# Patient Record
Sex: Male | Born: 1975 | Race: Black or African American | Hispanic: No | Marital: Married | Smoking: Never smoker
Health system: Southern US, Community
[De-identification: ages and names within clinical notes are randomized; demographics above are authoritative.]

---

## 2013-08-27 ENCOUNTER — Emergency Department (HOSPITAL_COMMUNITY): Payer: Self-pay

## 2013-08-27 ENCOUNTER — Emergency Department (HOSPITAL_COMMUNITY)
Admission: EM | Admit: 2013-08-27 | Discharge: 2013-08-27 | Disposition: A | Discharge status: Home / Self Care | Payer: Self-pay | Attending: Emergency Medicine | Admitting: Emergency Medicine

## 2013-08-27 ENCOUNTER — Encounter (HOSPITAL_COMMUNITY): Payer: Self-pay | Admitting: Nurse Practitioner

## 2013-08-27 DIAGNOSIS — R630 Anorexia: Secondary | ICD-10-CM | POA: Insufficient documentation

## 2013-08-27 DIAGNOSIS — R509 Fever, unspecified: Secondary | ICD-10-CM | POA: Insufficient documentation

## 2013-08-27 DIAGNOSIS — R0989 Other specified symptoms and signs involving the circulatory and respiratory systems: Secondary | ICD-10-CM | POA: Insufficient documentation

## 2013-08-27 DIAGNOSIS — R062 Wheezing: Secondary | ICD-10-CM | POA: Insufficient documentation

## 2013-08-27 DIAGNOSIS — R51 Headache: Secondary | ICD-10-CM | POA: Insufficient documentation

## 2013-08-27 DIAGNOSIS — J189 Pneumonia, unspecified organism: Secondary | ICD-10-CM

## 2013-08-27 MED ORDER — AZITHROMYCIN 250 MG PO TABS: 250.0000 mg | ORAL_TABLET | Freq: Every day | ORAL | Status: DC

## 2013-08-27 MED ORDER — ACETAMINOPHEN 325 MG PO TABS
650.0000 mg | ORAL_TABLET | Freq: Four times a day (QID) | ORAL | Status: DC | PRN
  Administered 2013-08-27: 650 mg via ORAL
  Filled 2013-08-27: qty 2

## 2013-08-27 MED ORDER — ALBUTEROL SULFATE HFA 108 (90 BASE) MCG/ACT IN AERS
2.0000 | INHALATION_SPRAY | RESPIRATORY_TRACT | Status: DC | PRN
  Administered 2013-08-27: 2 via RESPIRATORY_TRACT
  Filled 2013-08-27: qty 6.7

## 2013-08-27 NOTE — ED Provider Notes (Signed)
CSN: [id number]     Arrival date & time 08/27/13  1729 History  This chart was scribed for non-physician practitioner working with Junius Argyle, MD by Leone Payor, ED Scribe. This patient was seen in room TR05C/TR05C and the patient's care was started at 1729.    Chief Complaint  Patient presents with  . URI    The history is provided by the patient. No language interpreter was used.    HPI Comments: Jeremy Graham is a 37 y.o. male who presents to the Emergency Department complaining of 3 days of gradual onset, gradually worsening, constant cough. He has associated fever (102.3 in the ED), chills, HA, decreased appetite. He states the cough is productive of clear mucous. He has taken tylenol, robitussin, cough drops without relief. He denies abdominal pain, nausea, emesis, diarrhea, sore throat. Does report being here Saturday, 9/27 with girlfriend for similar symptoms and believes he caught this from her.  Denies recent travel. Denies hx of asthma or COPD.  Pt is an occasional alcohol user but denies smoking.   History reviewed. No pertinent past medical history. History reviewed. No pertinent past surgical history. History reviewed. No pertinent family history. History  Substance Use Topics  . Smoking status: Never Smoker   . Smokeless tobacco: Not on file  . Alcohol Use: Yes    Review of Systems  Constitutional: Positive for fever and chills.  HENT: Negative for sore throat.   Respiratory: Positive for cough. Negative for shortness of breath.   Gastrointestinal: Negative for nausea, vomiting, abdominal pain and diarrhea.  All other systems reviewed and are negative.    Allergies  Shellfish allergy  Home Medications   Current Outpatient Rx  Name  Route  Sig  Dispense  Refill  . azithromycin (ZITHROMAX) 250 MG tablet   Oral   Take 1 tablet (250 mg total) by mouth daily. Take first 2 tablets together, then 1 every day until finished.   6 tablet   0    BP 130/75   Pulse 84  Temp(Src) 101.3 F (38.5 C) (Oral)  Resp 18  SpO2 100% Physical Exam  Nursing note and vitals reviewed. Constitutional: He is oriented to person, place, and time. He appears well-developed and well-nourished.  HENT:  Head: Normocephalic and atraumatic.  Eyes: EOM are normal.  Neck: Normal range of motion. Neck supple.  Cardiovascular: Normal rate, regular rhythm and normal heart sounds.   Pulmonary/Chest: Effort normal. No respiratory distress. He has wheezes. He has rales. He exhibits no tenderness.  Coarse breath sounds throughout all lung fields. No respiratory distress, able to speak in full sentences w/o difficulty.  Abdominal: Soft. Bowel sounds are normal. He exhibits no distension. There is no tenderness.  Musculoskeletal: Normal range of motion.  Lymphadenopathy:    He has no cervical adenopathy.  Neurological: He is alert and oriented to person, place, and time.  Skin: Skin is warm and dry.  Psychiatric: He has a normal mood and affect. His behavior is normal.    ED Course  Procedures   DIAGNOSTIC STUDIES: Oxygen Saturation is 98% on RA, normal by my interpretation.    COORDINATION OF CARE: 7:01 PM Will prescribe antibiotics and an inhaler. Discussed treatment plan with pt at bedside and pt agreed to plan.   Labs Review Labs Reviewed - No data to display Imaging Review Dg Chest 2 View (if Patient Has Fever And/or Copd)  08/27/2013   CLINICAL DATA:  Upper respiratory infection with cough and fever.  EXAM: CHEST  2 VIEW  COMPARISON:  None.  FINDINGS: Two views of the chest demonstrate streaky densities in the left upper lobe and left hilar region. No evidence for pleural effusions. The right lung is clear. Heart and mediastinum are within normal limits. Bony thorax is intact.  IMPRESSION: Streaky densities in the left hilar and left upper lung region. Findings are concerning for pneumonia.   Electronically Signed   By: Richarda Overlie M.D.   On: 08/27/2013 18:41     MDM   1. Left upper lobe pneumonia    Pt c/u URI symptoms, found to have left upper lobe pneumonia on CXR.  Pt is otherwise healthy.  Discussed use of albuterol inhaler to aid with wheezing. Rx: azithromycin.  May use OTC tylenol and/or motrin as needed for fever and pain.  Return precautions given. Pt verbalized understanding and agreement with tx plan.  I personally performed the services described in this documentation, which was scribed in my presence. The recorded information has been reviewed and is accurate.    Junius Finner, PA-C 08/28/13 [phone]

## 2013-08-27 NOTE — ED Notes (Addendum)
C/o cough with clear mucous, fevers, headaches since Friday. No sob, speaking in full unlabored sentences now. Last tylenol at 0830 this am

## 2013-08-28 NOTE — ED Provider Notes (Signed)
Medical screening examination/treatment/procedure(s) were performed by non-physician practitioner and as supervising physician I was immediately available for consultation/collaboration.   Junius Argyle, MD 08/28/13 [phone]

## 2015-01-06 IMAGING — CR DG CHEST 2V
2 series · 2 of 2 positions shown · non-contrast
Comparison: None.

CLINICAL DATA: Upper respiratory infection with cough and fever.

EXAM:
CHEST  2 VIEW

[w chest pa]
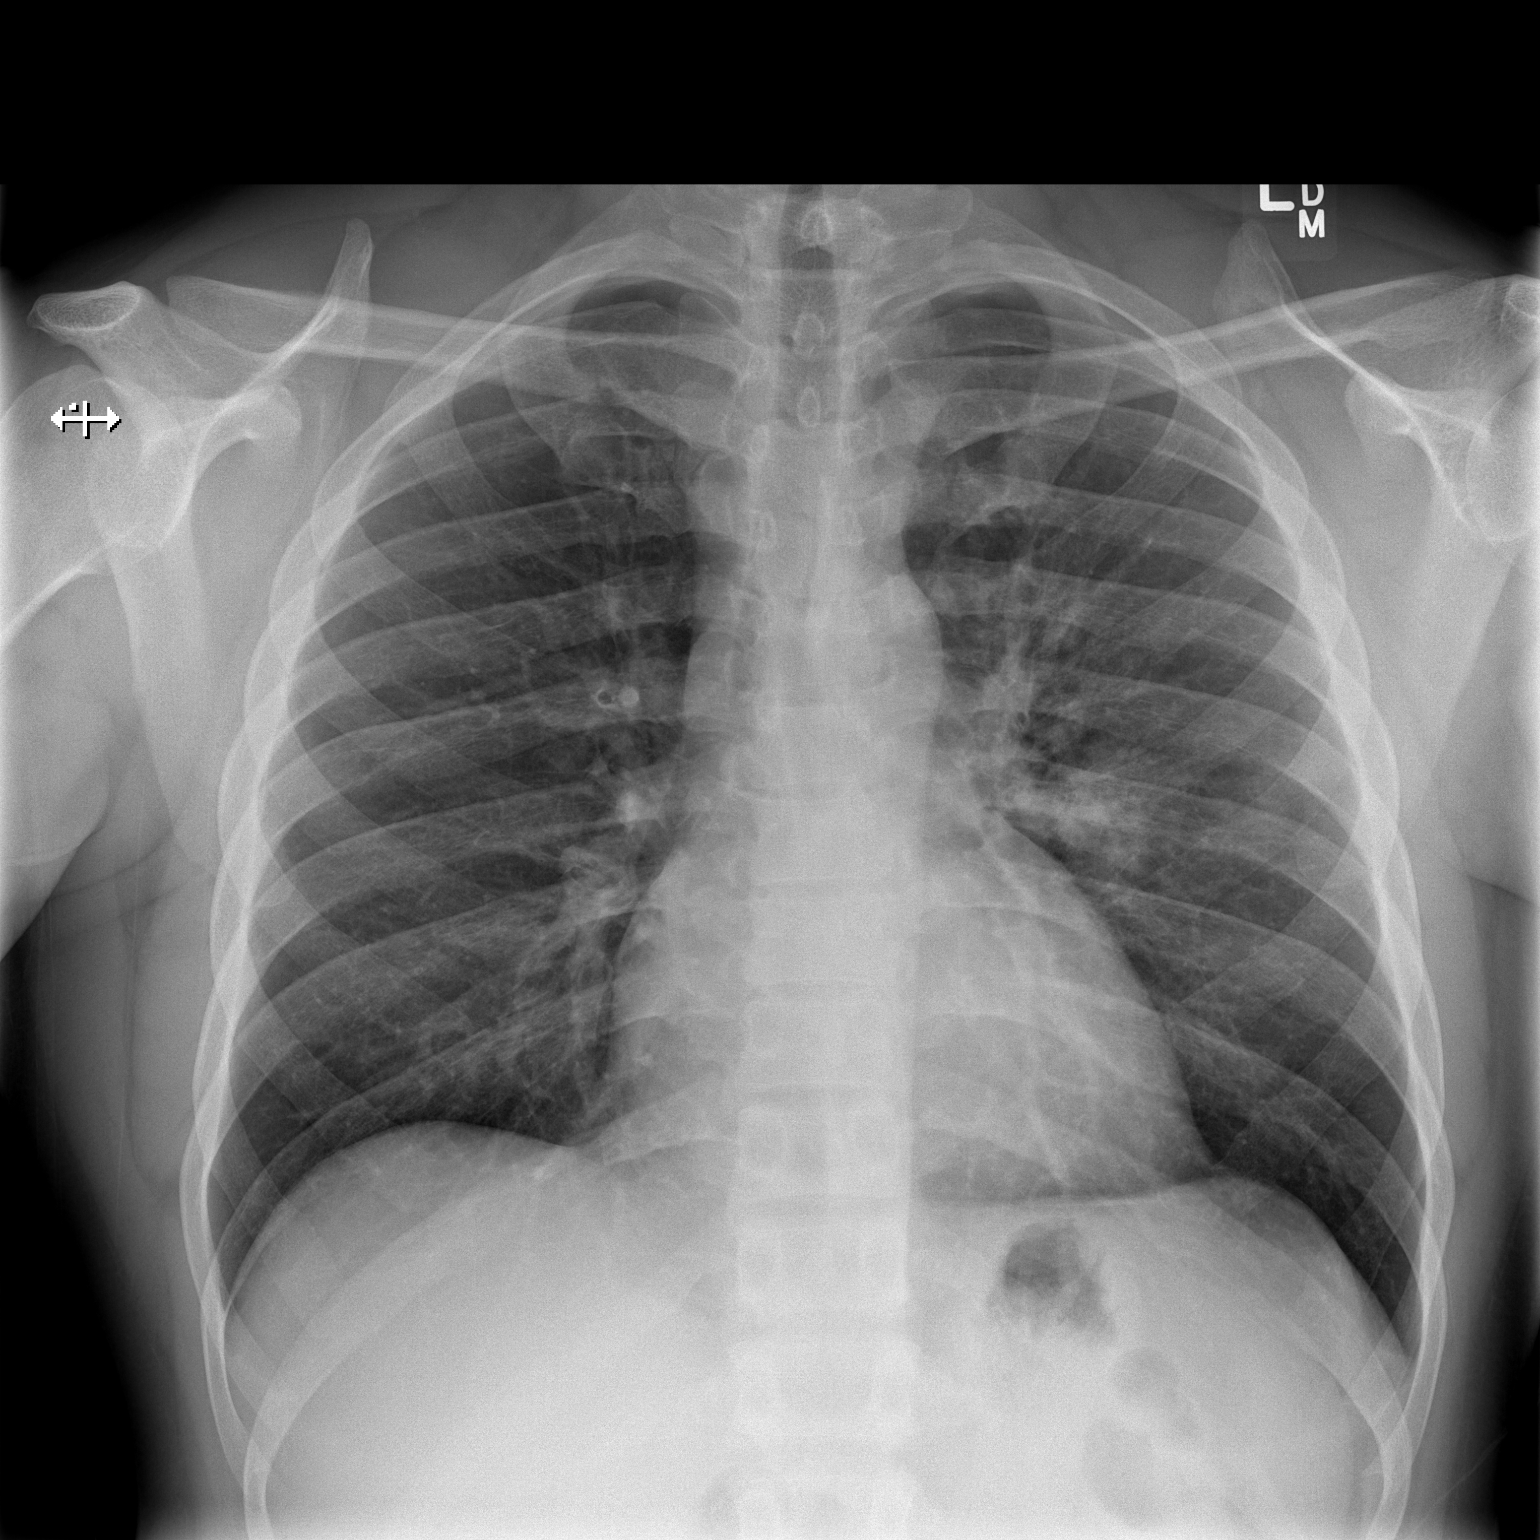

[w chest lat]
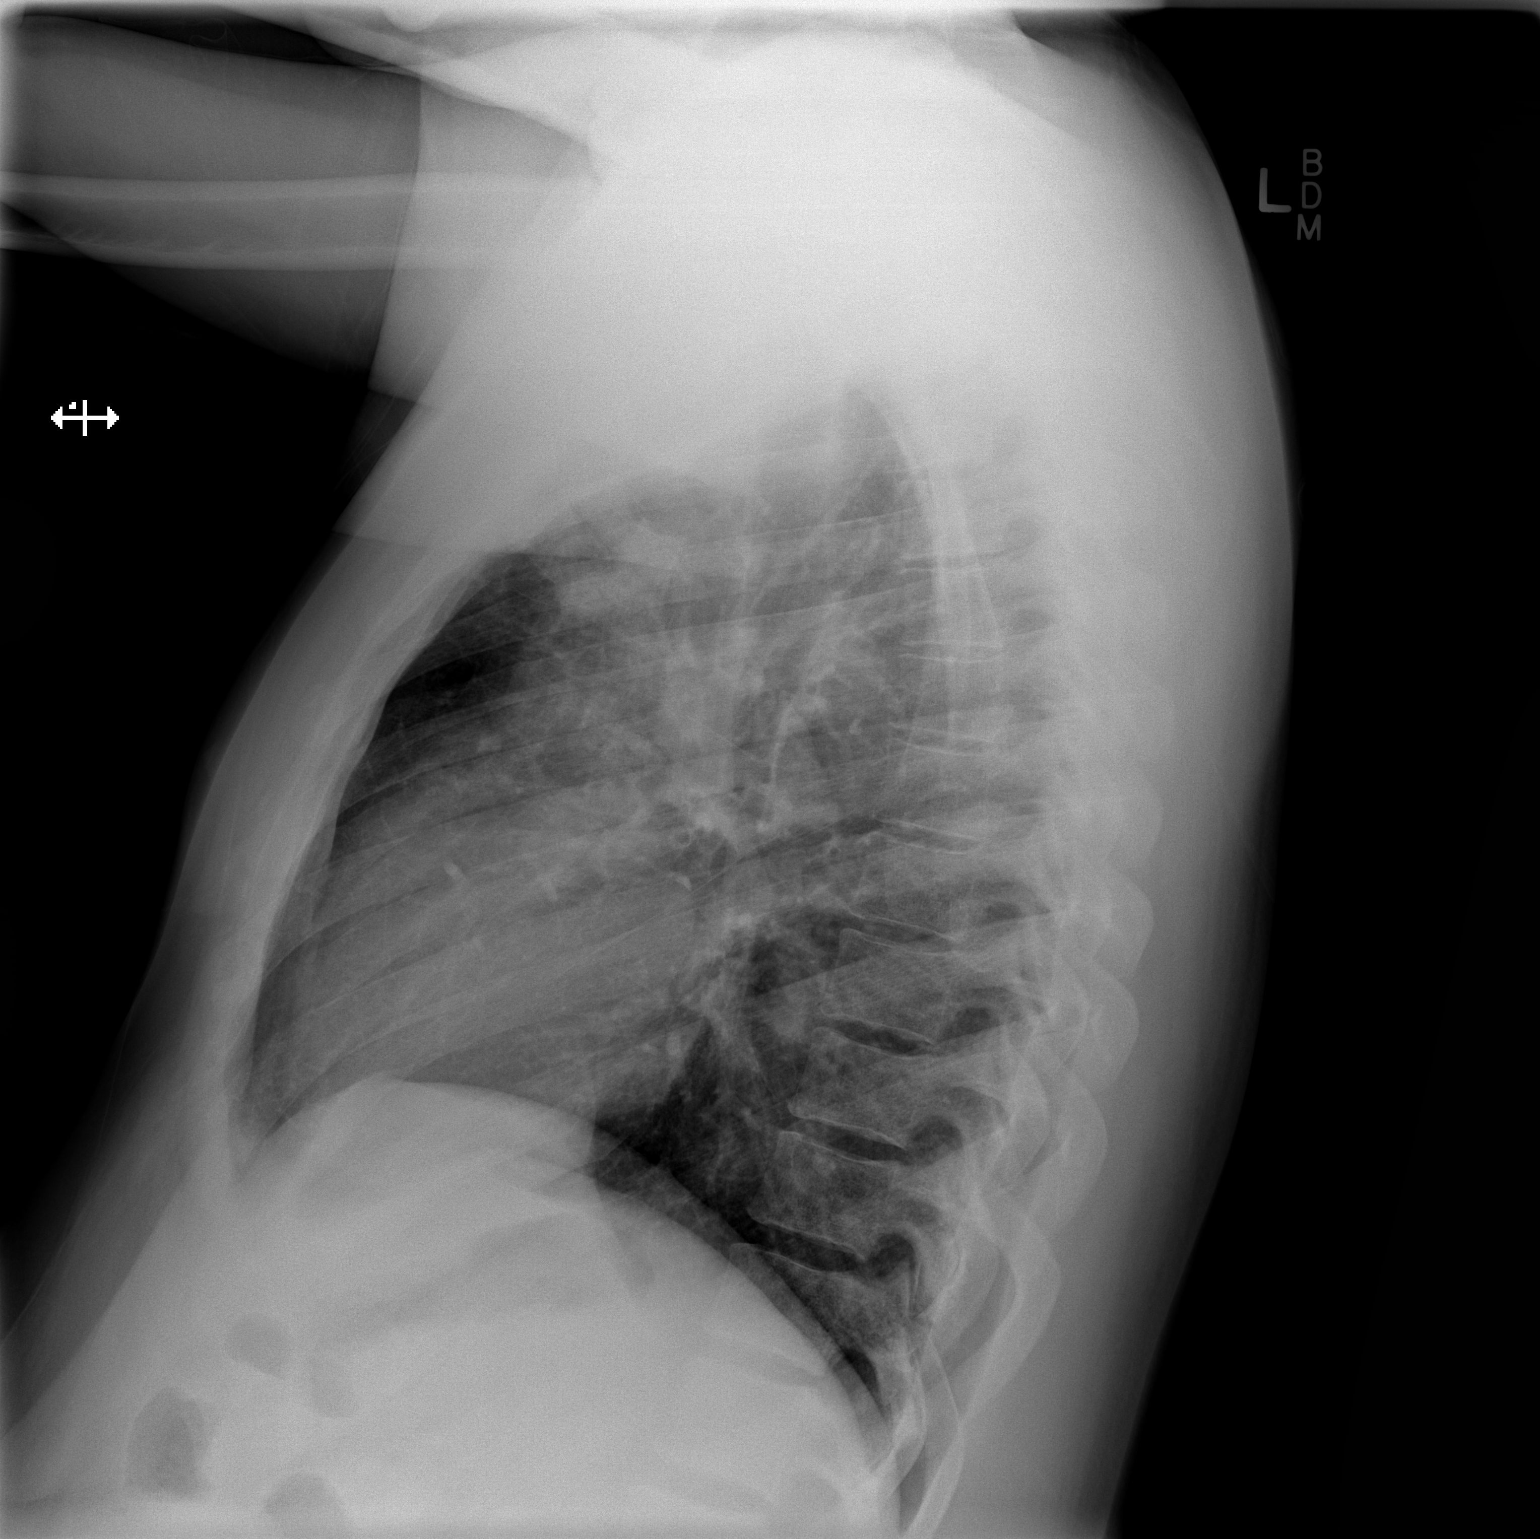

[2 of 2 positions shown; findings below may reference images not displayed]

FINDINGS: Two views of the chest demonstrate streaky densities in the left
upper lobe and left hilar region. No evidence for pleural effusions.
The right lung is clear. Heart and mediastinum are within normal
limits. Bony thorax is intact.
IMPRESSION: Streaky densities in the left hilar and left upper lung region.
Findings are concerning for pneumonia.

## 2020-06-12 ENCOUNTER — Telehealth: Payer: Self-pay | Admitting: Internal Medicine

## 2020-06-16 ENCOUNTER — Telehealth (INDEPENDENT_AMBULATORY_CARE_PROVIDER_SITE_OTHER): Payer: Medicaid Other | Admitting: Physician Assistant

## 2020-06-16 ENCOUNTER — Other Ambulatory Visit: Payer: Self-pay

## 2020-06-16 ENCOUNTER — Telehealth: Payer: Self-pay

## 2020-06-16 VITALS — Ht 69.0 in | Wt 230.0 lb

## 2020-06-16 DIAGNOSIS — Z13220 Encounter for screening for lipoid disorders: Secondary | ICD-10-CM | POA: Diagnosis not present

## 2020-06-16 DIAGNOSIS — L731 Pseudofolliculitis barbae: Secondary | ICD-10-CM

## 2020-06-16 DIAGNOSIS — Z1159 Encounter for screening for other viral diseases: Secondary | ICD-10-CM

## 2020-06-16 DIAGNOSIS — G8929 Other chronic pain: Secondary | ICD-10-CM

## 2020-06-16 DIAGNOSIS — M545 Low back pain: Secondary | ICD-10-CM

## 2020-06-16 DIAGNOSIS — Z0000 Encounter for general adult medical examination without abnormal findings: Secondary | ICD-10-CM

## 2020-06-16 DIAGNOSIS — E6609 Other obesity due to excess calories: Secondary | ICD-10-CM

## 2020-06-16 DIAGNOSIS — Z6833 Body mass index (BMI) 33.0-33.9, adult: Secondary | ICD-10-CM

## 2020-06-16 NOTE — Telephone Encounter (Signed)
Contacted pt. on the location of the mobile unit on 7/20 and times. Pt. confimed the location of the mobile unit and discussed stopping by for a walk in appt.

## 2020-06-16 NOTE — Progress Notes (Signed)
New Patient Office Visit  Subjective:  Patient ID: Jeremy Graham, male    DOB: 1975/12/03  Age: 44 y.o. MRN: [id number]  CC:  Chief Complaint  Patient presents with  . Establish Care   Virtual Visit via Telephone Note  I connected with Jeremy Graham on 06/16/20 at  3:50 PM EDT by telephone and verified that I am speaking with the correct person using two identifiers.  Location: Patient: Home Provider: Primary Care at [facility]   I discussed the limitations, risks, security and privacy concerns of performing an evaluation and management service by telephone and the availability of in person appointments. I also discussed with the patient that there may be a patient responsible charge related to this service. The patient expressed understanding and agreed to proceed.   History of Present Illness:   HPI Jeremy Graham reports that he feels his health is good overall.  Does complain of on and off low  back pain.  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  Reports this has been ongoing for several years.  Reports previous injury or trauma.  Reports that he will rest or use pain relievers over-the-counter with relief.  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   Reports that he has had an ingrown hair on his chin for the past several years, states it is not tender, but is bothersome.  Denies red streaking, erythema, purulence has tried hot compresses without relief.     Observations/Objective:  Patient history reviewed, no physical exam completed History reviewed. No pertinent past medical history.  History reviewed. No pertinent surgical history.  Family History  Problem Relation Age of Onset  . Diabetes Mother     Social History   Socioeconomic History  . Marital status: Married    Spouse name: Not on file  . Number of children: Not on file  . Years of education: Not on file  .  Highest education level: Not on file  Occupational History  . Not on file  Tobacco Use  . Smoking status: Never Smoker  . Smokeless tobacco: Never Used  Substance and Sexual Activity  . Alcohol use: Yes  . Drug use: No  . Sexual activity: Yes  Other Topics Concern  . Not on file  Social History Narrative  . Not on file   Social Determinants of Health   Financial Resource Strain:   . Difficulty of Paying Living Expenses:   Food Insecurity:   . Worried About Programme researcher, broadcasting/film/video in the Last Year:   . Barista in the Last Year:   Transportation Needs:   . Freight forwarder (Medical):   Marland Kitchen Lack of Transportation (Non-Medical):   Physical Activity:   . Days of Exercise per Week:   . Minutes of Exercise per Session:   Stress:   . Feeling of Stress :   Social Connections:   . Frequency of Communication with Friends and Family:   . Frequency of Social Gatherings with Friends and Family:   . Attends Religious Services:   . Active Member of Clubs or Organizations:   . Attends Banker Meetings:   Marland Kitchen Marital Status:   Intimate Partner Violence:   . Fear of Current or Ex-Partner:   . Emotionally Abused:   Marland Kitchen Physically Abused:   . Sexually Abused:     ROS Review of Systems  Constitutional: Negative for chills and fever.  HENT: Negative.   Eyes: Negative.   Respiratory: Negative.   Cardiovascular: Negative.   Gastrointestinal: Negative.   Endocrine: Negative.   Genitourinary: Negative.   Musculoskeletal: Positive for back pain.  Skin: Negative.   Allergic/Immunologic: Negative.   Neurological: Negative.   Hematological: Negative.   Psychiatric/Behavioral: Negative.     Objective:   Today's Vitals: Ht 5\' 9"  (1.753 m)   Wt 230 lb (104.3 kg)   BMI 33.97 kg/m     Assessment & Plan:   Problem List Items Addressed This Visit    None    Visit Diagnoses    Chronic midline low back pain without sciatica    -  Primary   Relevant Medications    aspirin 325 MG EC tablet   Wellness examination       Relevant Orders   CBC with Differential/Platelet   Comp. Metabolic Panel (12)   TSH   Screening, lipid       Relevant Orders   Lipid panel   Class 1 obesity due to excess calories with body mass index (BMI) of 33.0 to 33.9 in adult, unspecified whether serious comorbidity present       Encounter for HCV screening test for low risk patient       Relevant Orders   Hepatitis c antibody (reflex)   Ingrown hair          Outpatient Encounter Medications as of 06/16/2020  Medication Sig  . aspirin 325 MG EC tablet Take 325 mg by mouth daily.   No facility-administered encounter medications on file as of 06/16/2020.  Assessment and Plan: 1. Chronic midline low back pain without sciatica Patient education given on modest weight loss, gentle stretching, Voltaren over-the-counter, strengthening core.  2. Wellness examination Encouraged patient to come to mobile unit or Primary Care at [facility] for fasting labs.  Patient was given an appointment to follow-up with Dr. [facility] - CBC with Differential/Platelet; Future - Comp. Metabolic Panel (12); Future - TSH; Future  3. Screening, lipid  - Lipid panel; Future  4. Class 1 obesity due to excess calories with body mass index (BMI) of 33.0 to 33.9 in adult, unspecified whether serious comorbidity present   5. Encounter for HCV screening test for low risk patient  - Hepatitis c antibody (reflex); Future  6. Ingrown hair Patient encouraged to present to mobile unit for prompt evaluation   Follow Up Instructions:    I discussed the assessment and treatment plan with the patient. The patient was provided an opportunity to ask questions and all were answered. The patient agreed with the plan and demonstrated an understanding of the instructions.   The patient was advised to call back or seek an in-person evaluation if the symptoms worsen or if the condition fails to improve as  anticipated.  I provided 30 minutes of non-face-to-face time during this encounter.   Jeremy Lader S Mayers, PA-C    Follow-up: Return in about 1 day (around 06/17/2020) for Fasting  labs to mobile unit.   06/19/2020 Mayers, PA-C

## 2020-06-18 NOTE — Patient Instructions (Signed)
I encourage you to come to the mobile unit to be seen for the ingrown hair, and to have fasting labs completed.  You can also  call Primary Care at [facility] to make an appointment to have the fasting labs completed.  For your back pain, I encourage you to work on a modest weight loss, work on core strengthening exercises, use Voltaren over-the-counter and do gentle stretching several times a day most importantly before and after work activities.  Please let us know if there is anything else we can do for you  Roney Jaffe, PA-C Physician Assistant [facility] [email or web address]    Chronic Back Pain When back pain lasts longer than 3 months, it is called chronic back pain.The cause of your back pain may not be known. Some common causes include:  Wear and tear (degenerative disease) of the bones, ligaments, or disks in your back.  Inflammation and stiffness in your back (arthritis). People who have chronic back pain often go through certain periods in which the pain is more intense (flare-ups). Many people can learn to manage the pain with home care. Follow these instructions at home: Pay attention to any changes in your symptoms. Take these actions to help with your pain: Activity   Avoid bending and other activities that make the problem worse.  Maintain a proper position when standing or sitting: ? When standing, keep your upper back and neck straight, with your shoulders pulled back. Avoid slouching. ? When sitting, keep your back straight and relax your shoulders. Do not round your shoulders or pull them backward.  Do not sit or stand in one place for long periods of time.  Take brief periods of rest throughout the day. This will reduce your pain. Resting in a lying or standing position is usually better than sitting to rest.  When you are resting for longer periods, mix in some mild activity or stretching between periods  of rest. This will help to prevent stiffness and pain.  Get regular exercise. Ask your health care provider what activities are safe for you.  Do not lift anything that is heavier than 10 lb (4.5 kg). Always use proper lifting technique, which includes: ? Bending your knees. ? Keeping the load close to your body. ? Avoiding twisting.  Sleep on a firm mattress in a comfortable position. Try lying on your side with your knees slightly bent. If you lie on your back, put a pillow under your knees. Managing pain  If directed, apply ice to the painful area. Your health care provider may recommend applying ice during the first 24-48 hours after a flare-up begins. ? Put ice in a plastic bag. ? Place a towel between your skin and the bag. ? Leave the ice on for 20 minutes, 2-3 times per day.  If directed, apply heat to the affected area as often as told by your health care provider. Use the heat source that your health care provider recommends, such as a moist heat pack or a heating pad. ? Place a towel between your skin and the heat source. ? Leave the heat on for 20-30 minutes. ? Remove the heat if your skin turns bright red. This is especially important if you are unable to feel pain, heat, or cold. You may have a greater risk of getting burned.  Try soaking in a warm tub.  Take over-the-counter and prescription medicines only as told by your health care provider.  Keep all follow-up visits as told  by your health care provider. This is important. Contact a health care provider if:  You have pain that is not relieved with rest or medicine. Get help right away if:  You have weakness or numbness in one or both of your legs or feet.  You have trouble controlling your bladder or your bowels.  You have nausea or vomiting.  You have pain in your abdomen.  You have shortness of breath or you faint. This information is not intended to replace advice given to you by your health care  provider. Make sure you discuss any questions you have with your health care provider. Document Revised: 03/08/2019 Document Reviewed: 05/25/2017 Elsevier Patient Education  2020 [employer].

## 2020-06-19 ENCOUNTER — Ambulatory Visit: Payer: Medicaid Other | Admitting: Physician Assistant

## 2020-06-19 ENCOUNTER — Other Ambulatory Visit: Payer: Self-pay

## 2020-06-19 VITALS — BP 133/89 | HR 75 | Temp 98.8°F | Ht 71.0 in | Wt 248.0 lb

## 2020-06-19 DIAGNOSIS — Z0000 Encounter for general adult medical examination without abnormal findings: Secondary | ICD-10-CM

## 2020-06-19 DIAGNOSIS — Z1159 Encounter for screening for other viral diseases: Secondary | ICD-10-CM | POA: Diagnosis not present

## 2020-06-19 DIAGNOSIS — R030 Elevated blood-pressure reading, without diagnosis of hypertension: Secondary | ICD-10-CM

## 2020-06-19 DIAGNOSIS — M2740 Unspecified cyst of jaw: Secondary | ICD-10-CM

## 2020-06-19 DIAGNOSIS — Z13220 Encounter for screening for lipoid disorders: Secondary | ICD-10-CM | POA: Diagnosis not present

## 2020-06-19 NOTE — Patient Instructions (Signed)
I encourage you to return to the mobile unit or call Primary Care at [facility] to make an appointment for fasting labs.  I am starting a referral for you to be seen by general surgery to further evaluate the cyst on your jawline.  I encourage you to monitor your blood pressure, keep written log, bring readings to your next office visit.  Please let us know if there is anything else we can do for you  Roney Jaffe, PA-C Physician Assistant [facility] [email or web address]    Preventing Hypertension Hypertension, commonly called high blood pressure, is when the force of blood pumping through the arteries is too strong. Arteries are blood vessels that carry blood from the heart throughout the body. Over time, hypertension can damage the arteries and decrease blood flow to important parts of the body, including the brain, heart, and kidneys. Often, hypertension does not cause symptoms until blood pressure is very high. For this reason, it is important to have your blood pressure checked on a regular basis. Hypertension can often be prevented with diet and lifestyle changes. If you already have hypertension, you can control it with diet and lifestyle changes, as well as medicine. What nutrition changes can be made? Maintain a healthy diet. This includes:  Eating less salt (sodium). Ask your health care provider how much sodium is safe for you to have. The general recommendation is to consume less than 1 tsp (2,300 mg) of sodium a day. ? Do not add salt to your food. ? Choose low-sodium options when grocery shopping and eating out.  Limiting fats in your diet. You can do this by eating low-fat or fat-free dairy products and by eating less red meat.  Eating more fruits, vegetables, and whole grains. Make a goal to eat: ? 1-2 cups of fresh fruits and vegetables each day. ? 3-4 servings of whole grains each day.  Avoiding foods and  beverages that have added sugars.  Eating fish that contain healthy fats (omega-3 fatty acids), such as mackerel or salmon. If you need help putting together a healthy eating plan, try the DASH diet. This diet is high in fruits, vegetables, and whole grains. It is low in sodium, red meat, and added sugars. DASH stands for Dietary Approaches to Stop Hypertension. What lifestyle changes can be made?   Lose weight if you are overweight. Losing just 3?5% of your body weight can help prevent or control hypertension. ? For example, if your present weight is 200 lb (91 kg), a loss of 3-5% of your weight means losing 6-10 lb (2.7-4.5 kg). ? Ask your health care provider to help you with a diet and exercise plan to safely lose weight.  Get enough exercise. Do at least 150 minutes of moderate-intensity exercise each week. ? You could do this in short exercise sessions several times a day, or you could do longer exercise sessions a few times a week. For example, you could take a brisk 10-minute walk or bike ride, 3 times a day, for 5 days a week.  Find ways to reduce stress, such as exercising, meditating, listening to music, or taking a yoga class. If you need help reducing stress, ask your health care provider.  Do not smoke. This includes e-cigarettes. Chemicals in tobacco and nicotine products raise your blood pressure each time you smoke. If you need help quitting, ask your health care provider.  Avoid alcohol. If you drink alcohol, limit alcohol intake to no more than 1 drink a  day for nonpregnant women and 2 drinks a day for men. One drink equals 12 oz of beer, 5 oz of wine, or 1 oz of hard liquor. Why are these changes important? Diet and lifestyle changes can help you prevent hypertension, and they may make you feel better overall and improve your quality of life. If you have hypertension, making these changes will help you control it and help prevent major complications, such as:  Hardening and  narrowing of arteries that supply blood to: ? Your heart. This can cause a heart attack. ? Your brain. This can cause a stroke. ? Your kidneys. This can cause kidney failure.  Stress on your heart muscle, which can cause heart failure. What can I do to lower my risk?  Work with your health care provider to make a hypertension prevention plan that works for you. Follow your plan and keep all follow-up visits as told by your health care provider.  Learn how to check your blood pressure at home. Make sure that you know your personal target blood pressure, as told by your health care provider. How is this treated? In addition to diet and lifestyle changes, your health care provider may recommend medicines to help lower your blood pressure. You may need to try a few different medicines to find what works best for you. You also may need to take more than one medicine. Take over-the-counter and prescription medicines only as told by your health care provider. Where to find support Your health care provider can help you prevent hypertension and help you keep your blood pressure at a healthy level. Your local hospital or your community may also provide support services and prevention programs. The American Heart Association offers an online support network at: https://www.lee.net/ Where to find more information Learn more about hypertension from:  National Heart, Lung, and Blood Institute: https://www.peterson.org/  Centers for Disease Control and Prevention: AboutHD.co.nz  American Academy of Family Physicians: http://familydoctor.org/familydoctor/en/diseases-conditions/high-blood-pressure.printerview.all.html Learn more about the DASH diet from:  National Heart, Lung, and Blood Institute: WedMap.it Contact a health care provider if:  You think you are having a reaction to medicines you  have taken.  You have recurrent headaches or feel dizzy.  You have swelling in your ankles.  You have trouble with your vision. Summary  Hypertension often does not cause any symptoms until blood pressure is very high. It is important to get your blood pressure checked regularly.  Diet and lifestyle changes are the most important steps in preventing hypertension.  By keeping your blood pressure in a healthy range, you can prevent complications like heart attack, heart failure, stroke, and kidney failure.  Work with your health care provider to make a hypertension prevention plan that works for you. This information is not intended to replace advice given to you by your health care provider. Make sure you discuss any questions you have with your health care provider. Document Revised: 03/09/2019 Document Reviewed: 07/26/2016 Elsevier Patient Education  2020 [employer].

## 2020-06-19 NOTE — Progress Notes (Signed)
Established Patient Office Visit  Subjective:  Patient ID: Jeremy Graham, male    DOB: [birth year]  Age: 44 y.o. MRN: [id number]  CC: No chief complaint on file.   HPI Jeremy Graham presents for follow-up from his virtual visit earlier this week.  Reports that he has had the growth on his jawline for the last 10 years, states it has drained several times.  Reports when it starts to drain he will try to squeeze it to get the core out with out success.  Has not previously been treated for this.  Denies tenderness.  Has tried warm compresses without relief.   No past medical history on file.  No past surgical history on file.  Family History  Problem Relation Age of Onset  . Diabetes Mother     Social History   Socioeconomic History  . Marital status: Married    Spouse name: Not on file  . Number of children: Not on file  . Years of education: Not on file  . Highest education level: Not on file  Occupational History  . Not on file  Tobacco Use  . Smoking status: Never Smoker  . Smokeless tobacco: Never Used  Substance and Sexual Activity  . Alcohol use: Yes  . Drug use: No  . Sexual activity: Yes  Other Topics Concern  . Not on file  Social History Narrative  . Not on file   Social Determinants of Health   Financial Resource Strain:   . Difficulty of Paying Living Expenses:   Food Insecurity:   . Worried About Charity fundraiser in the Last Year:   . Arboriculturist in the Last Year:   Transportation Needs:   . Film/video editor (Medical):   Marland Kitchen Lack of Transportation (Non-Medical):   Physical Activity:   . Days of Exercise per Week:   . Minutes of Exercise per Session:   Stress:   . Feeling of Stress :   Social Connections:   . Frequency of Communication with Friends and Family:   . Frequency of Social Gatherings with Friends and Family:   . Attends Religious Services:   . Active Member of Clubs or Organizations:   . Attends Archivist  Meetings:   Marland Kitchen Marital Status:   Intimate Partner Violence:   . Fear of Current or Ex-Partner:   . Emotionally Abused:   Marland Kitchen Physically Abused:   . Sexually Abused:     Outpatient Medications Prior to Visit  Medication Sig Dispense Refill  . aspirin 325 MG EC tablet Take 325 mg by mouth daily.     No facility-administered medications prior to visit.    Allergies  Allergen Reactions  . Shellfish Allergy     ROS Review of Systems  Constitutional: Negative for chills, fatigue and fever.  HENT: Negative.   Eyes: Negative.   Respiratory: Negative.   Cardiovascular: Negative.   Gastrointestinal: Negative.   Endocrine: Negative.   Genitourinary: Negative.   Musculoskeletal: Negative.   Skin: Negative.   Allergic/Immunologic: Negative.   Neurological: Negative.   Hematological: Negative.   Psychiatric/Behavioral: Negative.       Objective:    Physical Exam Constitutional:      Appearance: Normal appearance. He is obese.  HENT:     Head: Normocephalic and atraumatic.     Right Ear: Tympanic membrane, ear canal and external ear normal.     Left Ear: Tympanic membrane, ear canal and external ear normal.  Mouth/Throat:     Mouth: Mucous membranes are moist.     Pharynx: Oropharynx is clear.  Eyes:     Extraocular Movements: Extraocular movements intact.     Conjunctiva/sclera: Conjunctivae normal.     Pupils: Pupils are equal, round, and reactive to light.  Cardiovascular:     Rate and Rhythm: Normal rate and regular rhythm.     Pulses: Normal pulses.     Heart sounds: Normal heart sounds.  Pulmonary:     Effort: Pulmonary effort is normal.     Breath sounds: Normal breath sounds.  Abdominal:     General: Abdomen is flat. Bowel sounds are normal.     Palpations: Abdomen is soft.  Musculoskeletal:        General: Normal range of motion.     Cervical back: Normal range of motion and neck supple.  Skin:    General: Skin is warm.       Neurological:      General: No focal deficit present.     Mental Status: He is alert and oriented to person, place, and time.  Psychiatric:        Mood and Affect: Mood normal.        Behavior: Behavior normal.        Thought Content: Thought content normal.        Judgment: Judgment normal.     There were no vitals taken for this visit. Wt Readings from Last 3 Encounters:  06/16/20 230 lb (104.3 kg)     Health Maintenance Due  Topic Date Due  . Hepatitis C Screening  Never done  . COVID-19 Vaccine (1) Never done  . HIV Screening  Never done  . TETANUS/TDAP  Never done    There are no preventive care reminders to display for this patient.  No results found for: TSH No results found for: WBC, HGB, HCT, MCV, PLT No results found for: NA, K, CHLORIDE, CO2, GLUCOSE, BUN, CREATININE, BILITOT, ALKPHOS, AST, ALT, PROT, ALBUMIN, CALCIUM, ANIONGAP, EGFR, GFR No results found for: CHOL No results found for: HDL No results found for: LDLCALC No results found for: TRIG No results found for: CHOLHDL No results found for: HGBA1C    Assessment & Plan:   Problem List Items Addressed This Visit    None     Cyst of jaw  - Ambulatory referral to General Surgery   Elevated Blood Pressure without dx of HTN Patient education given on prevention of hypertension, encouraged to check blood pressure at home, keep written log and bring back to next office visit.  Patient to return to mobile unit in 4 days for fasting labs and recheck blood pressure   I have reviewed the patient's medical history (PMH, PSH, Social History, Family History, Medications, and allergies) , and have been updated if relevant. I spent 20 minutes reviewing chart and  face to face time with patient.    No orders of the defined types were placed in this encounter.   Follow-up: No follow-ups on file.    Loraine Grip Mayers, PA-C

## 2020-07-09 ENCOUNTER — Encounter: Payer: Self-pay | Admitting: Physician Assistant

## 2020-08-05 NOTE — Progress Notes (Signed)
Patient never reported back to [facility] for lab draw. Patient was contacted by MA and CM

## 2020-12-31 ENCOUNTER — Other Ambulatory Visit: Payer: Self-pay

## 2021-01-02 ENCOUNTER — Other Ambulatory Visit: Payer: Medicaid Other

## 2021-01-02 DIAGNOSIS — Z20822 Contact with and (suspected) exposure to covid-19: Secondary | ICD-10-CM

## 2021-01-03 LAB — NOVEL CORONAVIRUS, NAA: SARS-CoV-2, NAA: NOT DETECTED

## 2021-01-03 LAB — SARS-COV-2, NAA 2 DAY TAT

## 2024-06-14 ENCOUNTER — Encounter: Payer: Self-pay | Admitting: Physician Assistant
